# Patient Record
Sex: Female | Born: 1965 | Race: Black or African American | Hispanic: No | Marital: Married | Smoking: Never smoker
Health system: Southern US, Community
[De-identification: ages and names within clinical notes are randomized; demographics above are authoritative.]

---

## 2016-10-01 ENCOUNTER — Emergency Department: Payer: Worker's Compensation

## 2016-10-01 ENCOUNTER — Encounter: Payer: Self-pay | Admitting: *Deleted

## 2016-10-01 ENCOUNTER — Emergency Department
Admission: EM | Admit: 2016-10-01 | Discharge: 2016-10-01 | Disposition: A | Discharge status: Home / Self Care | Payer: Worker's Compensation | Attending: Emergency Medicine | Admitting: Emergency Medicine

## 2016-10-01 DIAGNOSIS — X509XXA Other and unspecified overexertion or strenuous movements or postures, initial encounter: Secondary | ICD-10-CM | POA: Insufficient documentation

## 2016-10-01 DIAGNOSIS — Y929 Unspecified place or not applicable: Secondary | ICD-10-CM | POA: Insufficient documentation

## 2016-10-01 DIAGNOSIS — S46091A Other injury of muscle(s) and tendon(s) of the rotator cuff of right shoulder, initial encounter: Secondary | ICD-10-CM | POA: Diagnosis not present

## 2016-10-01 DIAGNOSIS — Y939 Activity, unspecified: Secondary | ICD-10-CM | POA: Insufficient documentation

## 2016-10-01 DIAGNOSIS — M67911 Unspecified disorder of synovium and tendon, right shoulder: Secondary | ICD-10-CM

## 2016-10-01 DIAGNOSIS — S4991XA Unspecified injury of right shoulder and upper arm, initial encounter: Secondary | ICD-10-CM | POA: Diagnosis present

## 2016-10-01 DIAGNOSIS — Y990 Civilian activity done for income or pay: Secondary | ICD-10-CM | POA: Insufficient documentation

## 2016-10-01 MED ORDER — TRAMADOL HCL 50 MG PO TABS: 50.0000 mg | ORAL_TABLET | Freq: Four times a day (QID) | ORAL | 0 refills | Status: AC | PRN

## 2016-10-01 MED ORDER — IBUPROFEN 800 MG PO TABS: 800.0000 mg | ORAL_TABLET | Freq: Three times a day (TID) | ORAL | 0 refills | Status: AC | PRN

## 2016-10-01 NOTE — ED Provider Notes (Signed)
[facility] Emergency Department Provider Note  ____________________________________________  Time seen: Approximately 9:52 PM  I have reviewed the triage vital signs and the nursing notes.   HISTORY  Chief Complaint Shoulder Injury    HPI Krista Wong is a 51 y.o. 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 Patient states she was pulling on a resident and heard a pop. She is able to move her arm but with pain. She can move her wrist and fingers normally. No sensation changes. Pain is primarily in the front of her shoulder. No additional injuries. She denies shortness of breath, chest pain, nausea, vomiting, abdominal pain.   History reviewed. No pertinent past medical history.  There are no active problems to display for this patient.   History reviewed. No pertinent surgical history.  Prior to Admission medications   Medication Sig Start Date End Date Taking? Authorizing Provider  ibuprofen (ADVIL,MOTRIN) 800 MG tablet Take 1 tablet (800 mg total) by mouth every 8 (eight) hours as needed. 10/01/16   Enid Derry, PA-C  traMADol (ULTRAM) 50 MG tablet Take 1 tablet (50 mg total) by mouth every 6 (six) hours as needed. 10/01/16 10/04/16  Enid Derry, PA-C    Allergies Patient has no allergy information on record.  History reviewed. No pertinent family history.  Social History Social History  Substance Use Topics  . Smoking status: Never Smoker  . Smokeless tobacco: Never Used  . Alcohol use No     Review of Systems  Constitutional: No fever/chills Cardiovascular: No chest pain. Respiratory: No SOB. Gastrointestinal: No abdominal pain.  No nausea, no vomiting.  Musculoskeletal: Positive for right shoulder pain. Skin: Negative for rash, abrasions, lacerations, ecchymosis. Neurological: Negative for headaches, numbness or  tingling   ____________________________________________   PHYSICAL EXAM:  VITAL SIGNS: ED Triage Vitals  Enc Vitals Group     BP 10/01/16 1934 (!) 162/75     Pulse Rate 10/01/16 1934 77     Resp 10/01/16 1934 16     Temp 10/01/16 1934 98 F (36.7 C)     Temp Source 10/01/16 1934 Oral     SpO2 10/01/16 1934 99 %     Weight 10/01/16 1933 238 lb (108 kg)     Height 10/01/16 1933 5' 4.5" (1.638 m)     Head Circumference --      Peak Flow --      Pain Score 10/01/16 1933 9     Pain Loc --      Pain Edu? --      Excl. in GC? --      Constitutional: Alert and oriented. Well appearing and in no acute distress. Eyes: Conjunctivae are normal. PERRL. EOMI. Head: Atraumatic. ENT:      Ears:      Nose: No congestion/rhinnorhea.      Mouth/Throat: Mucous membranes are moist.  Neck: No stridor. No cervical spine tenderness to palpation. Cardiovascular: Normal rate, regular rhythm.  Good peripheral circulation. 2+ radial pulses. Respiratory: Normal respiratory effort without tachypnea or retractions. Lungs CTAB. Good air entry to the bases with no decreased or absent breath sounds. Musculoskeletal: Limited range of motion of right shoulder due to pain. Tenderness to palpation over anterior shoulder. No gross deformities appreciated. Neurologic:  Normal speech and language. No gross focal neurologic deficits are appreciated.  Skin:  Skin is warm, dry and intact. No rash noted.   ____________________________________________   LABS (all labs  ordered are listed, but only abnormal results are displayed)  Labs Reviewed - No data to display ____________________________________________  EKG   ____________________________________________  RADIOLOGY Lexine BatonI, Nahima Ales, personally viewed and evaluated these images (plain radiographs) as part of my medical decision making, as well as reviewing the written report by the radiologist.  Dg Shoulder Right  Result Date: 10/01/2016 CLINICAL  DATA:  51 year old female with right shoulder injury and pain. EXAM: RIGHT SHOULDER - 2+ VIEW COMPARISON:  None. FINDINGS: There is no acute fracture or dislocation. The bones are well mineralized. No significant arthritic changes. The soft tissues appear unremarkable. No radiopaque foreign object. IMPRESSION: Negative. Electronically Signed   By: Elgie CollardArash  Radparvar M.D.   On: 10/01/2016 20:09    ____________________________________________    PROCEDURES  Procedure(s) performed:    Procedures    Medications - No data to display   ____________________________________________   INITIAL IMPRESSION / ASSESSMENT AND PLAN / ED COURSE  Pertinent labs & imaging results that were available during my care of the patient were reviewed by me and considered in my medical decision making (see chart for details).  Review of the [place] CSRS was performed in accordance of the [facility] prior to dispensing any controlled drugs.     She presented to the emergency department for shoulder injury. Vital signs and exam are reassuring. X-ray negative for acute bony abnormalities. Patient likely injured a rotator cuff muscle. Arm is neurovascularly intact. Patient will be discharged home with prescriptions for a short course of tramadol. Patient is to follow up with PCP as directed. Patient is given ED precautions to return to the ED for any worsening or new symptoms.     ____________________________________________  FINAL CLINICAL IMPRESSION(S) / ED DIAGNOSES  Final diagnoses:  Disorder of right rotator cuff      NEW MEDICATIONS STARTED DURING THIS VISIT:  Discharge Medication List as of 10/01/2016 10:20 PM    START taking these medications   Details  ibuprofen (ADVIL,MOTRIN) 800 MG tablet Take 1 tablet (800 mg total) by mouth every 8 (eight) hours as needed., Starting Sun 10/01/2016, Print    traMADol (ULTRAM) 50 MG tablet Take 1 tablet (50 mg total) by mouth every 6 (six) hours as needed.,  Starting Sun 10/01/2016, Until Wed 10/04/2016, Print            This chart was dictated using voice recognition software/Dragon. Despite best efforts to proofread, errors can occur which can change the meaning. Any change was purely unintentional.    Enid DerryWagner, Ocie Stanzione, PA-C 10/01/16 2240    Jeanmarie PlantMcShane, James A, MD 10/01/16 2248

## 2016-10-01 NOTE — ED Notes (Signed)
workmans comp UDS completed, paperwork on chart, specimin walked to lab

## 2016-10-01 NOTE — ED Triage Notes (Signed)
Pt reports after having transferred a pt pt is now having sharp pains in right shoulder and collar bone area. PT unable to lift right arm up with out sharp pains. Color is intact and sensation is intact.

## 2018-03-02 IMAGING — CR DG SHOULDER 2+V*R*
1 series · 3 of 3 positions shown · non-contrast
Comparison: None.

CLINICAL DATA: 51-year-old female with right shoulder injury and
pain.

EXAM:
RIGHT SHOULDER - 2+ VIEW

[Series 1: dg shoulder right · 0.14mm/px · 3 of 3 slices shown]
[im 1/3]
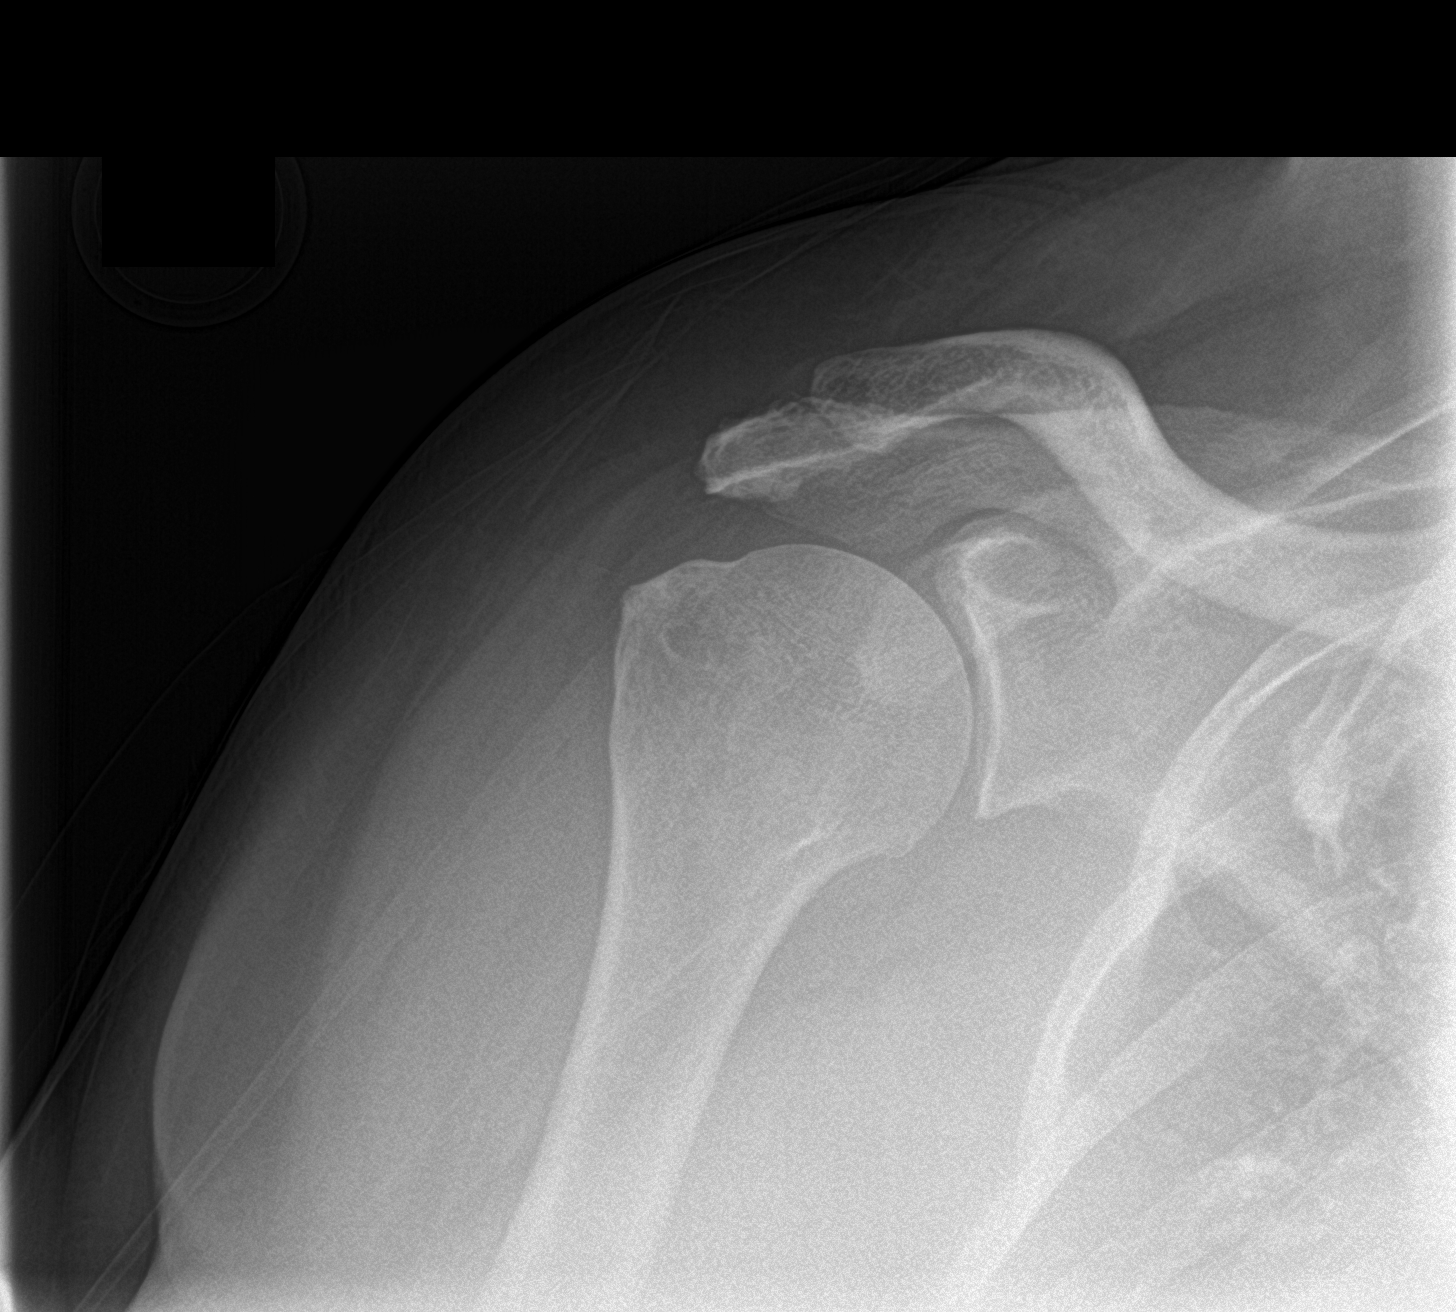
[im 2/3]
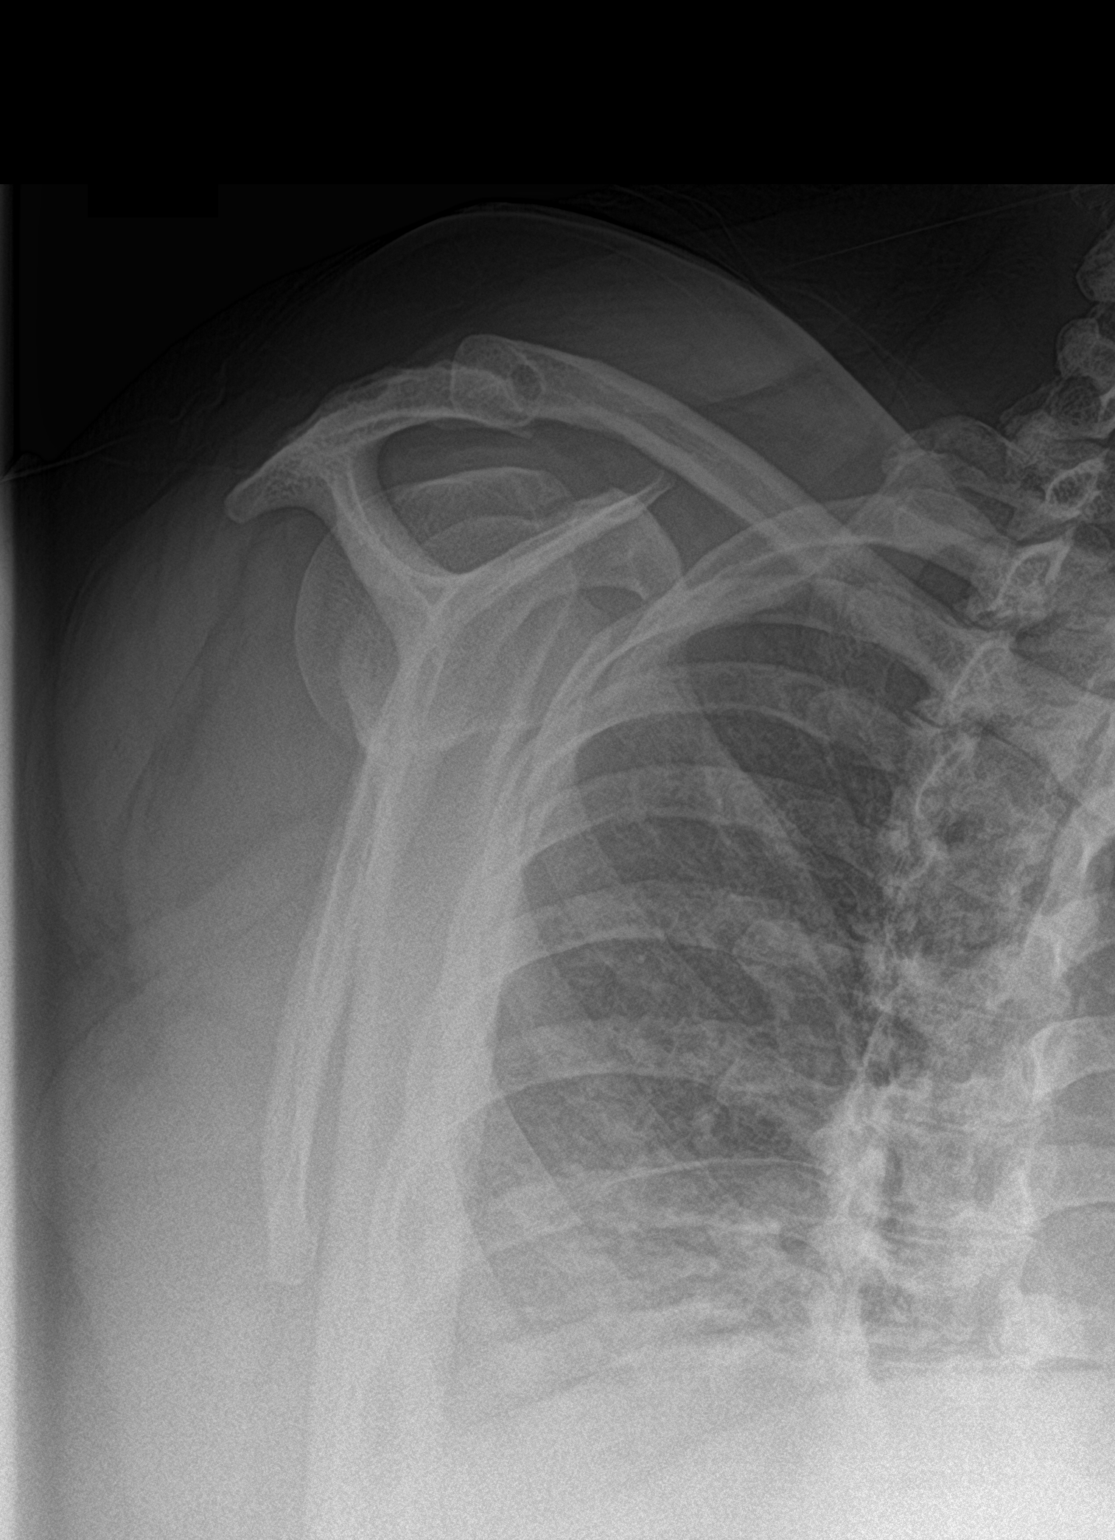
[im 3/3]
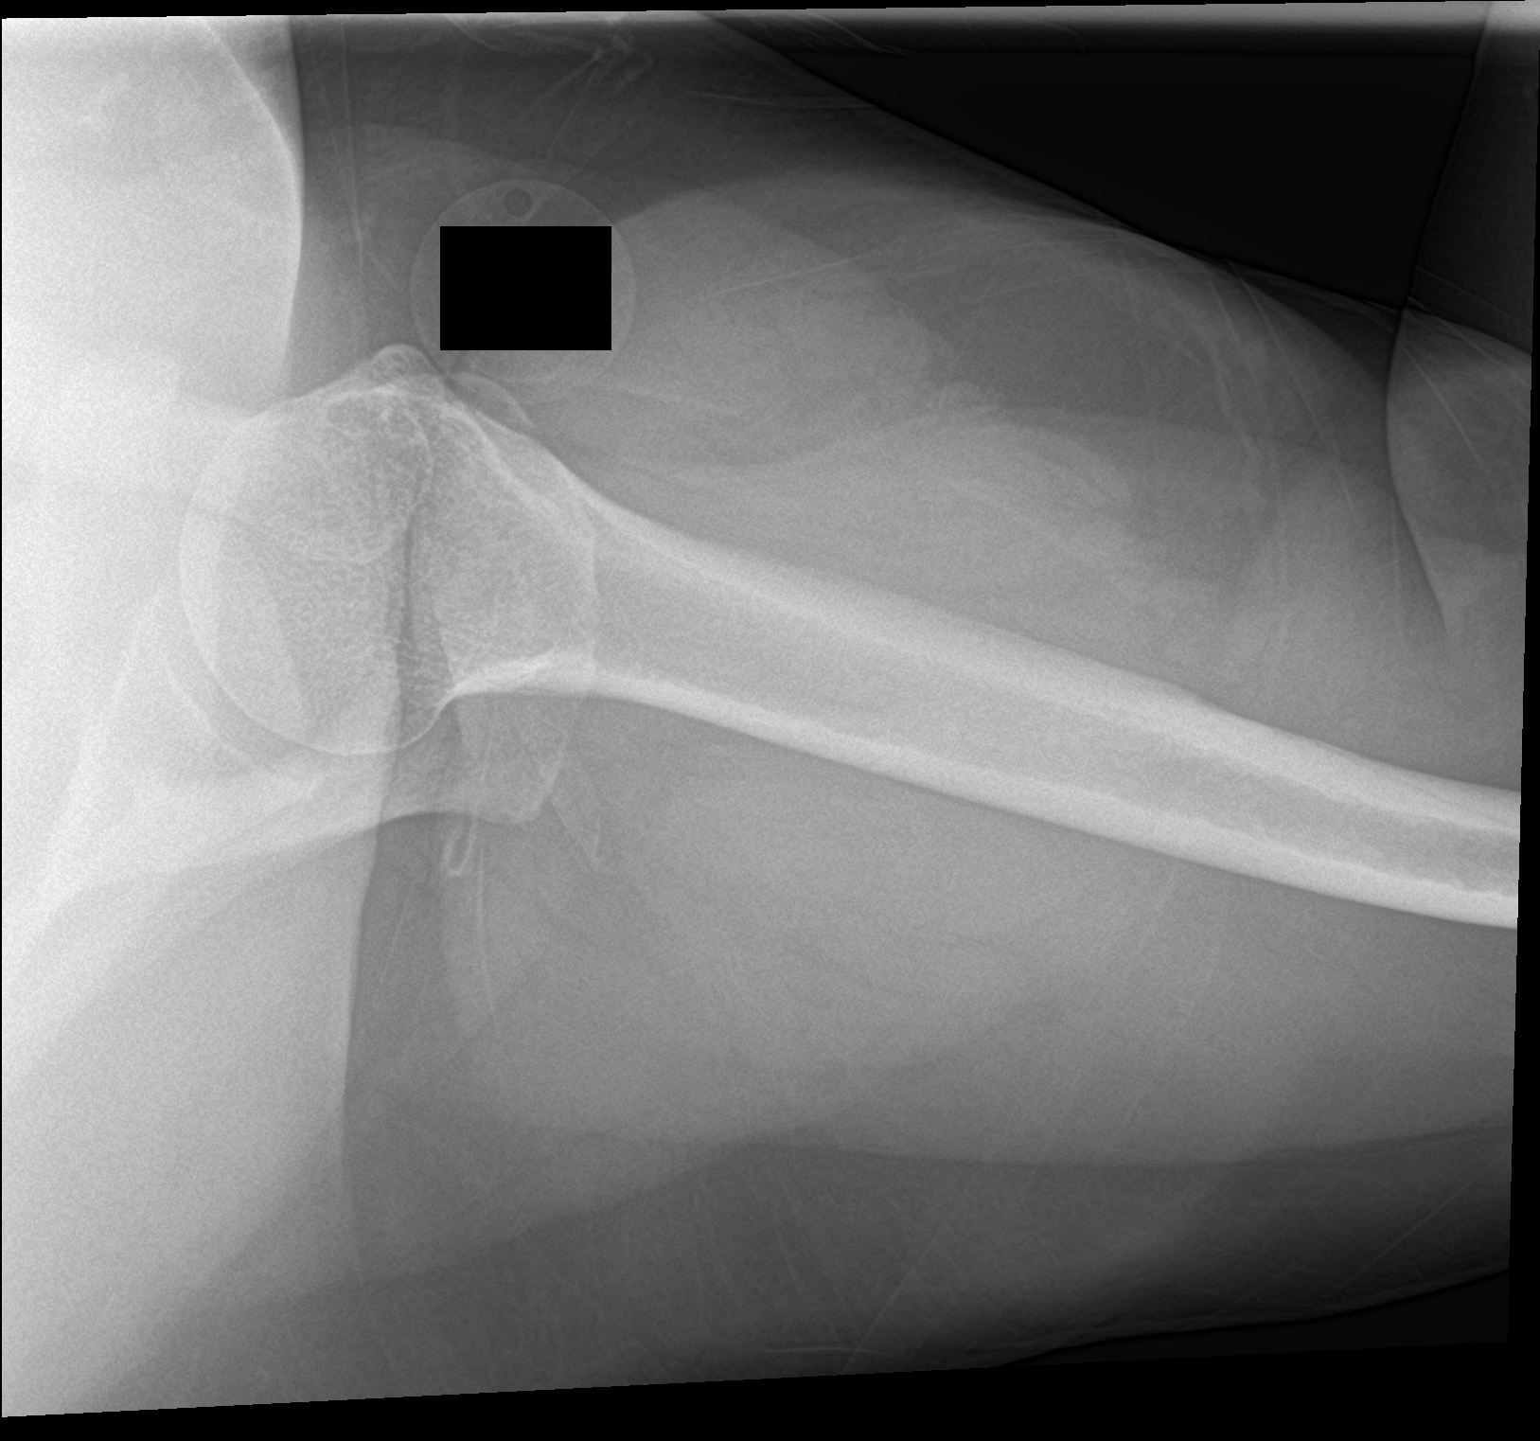

[3 of 3 positions shown; findings below may reference images not displayed]

FINDINGS: There is no acute fracture or dislocation. The bones are well
mineralized. No significant arthritic changes. The soft tissues
appear unremarkable. No radiopaque foreign object.
IMPRESSION: Negative.
